# Patient Record
Sex: Male | Born: 1935 | Race: Black or African American | Hispanic: No | Marital: Single | Smoking: Never smoker
Health system: Southern US, Community
[De-identification: ages and names within clinical notes are randomized; demographics above are authoritative.]

## PROBLEM LIST (undated history)

## (undated) DIAGNOSIS — E119 Type 2 diabetes mellitus without complications: Secondary | ICD-10-CM

## (undated) DIAGNOSIS — K219 Gastro-esophageal reflux disease without esophagitis: Secondary | ICD-10-CM

## (undated) DIAGNOSIS — I69351 Hemiplegia and hemiparesis following cerebral infarction affecting right dominant side: Secondary | ICD-10-CM

## (undated) DIAGNOSIS — I252 Old myocardial infarction: Secondary | ICD-10-CM

## (undated) DIAGNOSIS — F0150 Vascular dementia without behavioral disturbance: Secondary | ICD-10-CM

## (undated) DIAGNOSIS — I639 Cerebral infarction, unspecified: Secondary | ICD-10-CM

## (undated) DIAGNOSIS — H532 Diplopia: Secondary | ICD-10-CM

## (undated) DIAGNOSIS — E559 Vitamin D deficiency, unspecified: Secondary | ICD-10-CM

## (undated) DIAGNOSIS — K5900 Constipation, unspecified: Secondary | ICD-10-CM

## (undated) DIAGNOSIS — H409 Unspecified glaucoma: Secondary | ICD-10-CM

## (undated) DIAGNOSIS — S42309A Unspecified fracture of shaft of humerus, unspecified arm, initial encounter for closed fracture: Secondary | ICD-10-CM

## (undated) DIAGNOSIS — I2510 Atherosclerotic heart disease of native coronary artery without angina pectoris: Secondary | ICD-10-CM

## (undated) DIAGNOSIS — I10 Essential (primary) hypertension: Secondary | ICD-10-CM

## (undated) HISTORY — PX: EXPLORATORY LAPAROTOMY: SUR591

---

## 1997-12-25 ENCOUNTER — Other Ambulatory Visit: Admission: RE | Admit: 1997-12-25 | Discharge: 1997-12-25 | Payer: Self-pay | Admitting: Family Medicine

## 1998-02-12 ENCOUNTER — Other Ambulatory Visit: Admission: RE | Admit: 1998-02-12 | Discharge: 1998-02-12 | Payer: Self-pay | Admitting: Family Medicine

## 1998-02-26 ENCOUNTER — Emergency Department (HOSPITAL_COMMUNITY): Admission: EM | Admit: 1998-02-26 | Discharge: 1998-02-26 | Payer: Self-pay | Admitting: Emergency Medicine

## 1998-05-11 ENCOUNTER — Emergency Department (HOSPITAL_COMMUNITY): Admission: EM | Admit: 1998-05-11 | Discharge: 1998-05-12 | Payer: Self-pay | Admitting: Emergency Medicine

## 1998-05-13 ENCOUNTER — Emergency Department (HOSPITAL_COMMUNITY): Admission: EM | Admit: 1998-05-13 | Discharge: 1998-05-13 | Payer: Self-pay | Admitting: Emergency Medicine

## 1999-08-04 ENCOUNTER — Inpatient Hospital Stay (HOSPITAL_COMMUNITY): Admission: EM | Admit: 1999-08-04 | Discharge: 1999-08-06 | Payer: Self-pay | Admitting: Emergency Medicine

## 1999-08-04 ENCOUNTER — Encounter: Payer: Self-pay | Admitting: Emergency Medicine

## 1999-08-05 ENCOUNTER — Encounter: Payer: Self-pay | Admitting: Internal Medicine

## 1999-09-30 ENCOUNTER — Inpatient Hospital Stay (HOSPITAL_COMMUNITY): Admission: RE | Admit: 1999-09-30 | Discharge: 1999-10-02 | Payer: Self-pay | Admitting: General Surgery

## 1999-11-17 ENCOUNTER — Encounter: Admission: RE | Admit: 1999-11-17 | Discharge: 1999-11-17 | Payer: Self-pay | Admitting: Family Medicine

## 1999-11-17 ENCOUNTER — Encounter: Payer: Self-pay | Admitting: Family Medicine

## 2000-01-01 ENCOUNTER — Encounter: Payer: Self-pay | Admitting: Emergency Medicine

## 2000-01-01 ENCOUNTER — Emergency Department (HOSPITAL_COMMUNITY): Admission: EM | Admit: 2000-01-01 | Discharge: 2000-01-01 | Payer: Self-pay | Admitting: *Deleted

## 2000-01-03 ENCOUNTER — Encounter: Payer: Self-pay | Admitting: Emergency Medicine

## 2000-01-03 ENCOUNTER — Emergency Department (HOSPITAL_COMMUNITY): Admission: EM | Admit: 2000-01-03 | Discharge: 2000-01-03 | Payer: Self-pay | Admitting: Emergency Medicine

## 2000-03-27 ENCOUNTER — Inpatient Hospital Stay (HOSPITAL_COMMUNITY): Admission: EM | Admit: 2000-03-27 | Discharge: 2000-03-31 | Payer: Self-pay | Admitting: Emergency Medicine

## 2000-03-27 ENCOUNTER — Encounter: Payer: Self-pay | Admitting: Emergency Medicine

## 2000-03-28 ENCOUNTER — Encounter: Payer: Self-pay | Admitting: Pulmonary Disease

## 2000-03-29 ENCOUNTER — Encounter: Payer: Self-pay | Admitting: Pulmonary Disease

## 2000-03-30 ENCOUNTER — Encounter: Payer: Self-pay | Admitting: Pulmonary Disease

## 2000-05-08 ENCOUNTER — Emergency Department (HOSPITAL_COMMUNITY): Admission: EM | Admit: 2000-05-08 | Discharge: 2000-05-08 | Payer: Self-pay | Admitting: Emergency Medicine

## 2000-05-10 ENCOUNTER — Emergency Department (HOSPITAL_COMMUNITY): Admission: EM | Admit: 2000-05-10 | Discharge: 2000-05-10 | Payer: Self-pay | Admitting: Emergency Medicine

## 2000-05-23 ENCOUNTER — Emergency Department (HOSPITAL_COMMUNITY): Admission: EM | Admit: 2000-05-23 | Discharge: 2000-05-23 | Payer: Self-pay | Admitting: Emergency Medicine

## 2000-06-18 ENCOUNTER — Encounter: Payer: Self-pay | Admitting: Emergency Medicine

## 2000-06-18 ENCOUNTER — Emergency Department (HOSPITAL_COMMUNITY): Admission: EM | Admit: 2000-06-18 | Discharge: 2000-06-18 | Payer: Self-pay | Admitting: Emergency Medicine

## 2001-05-10 ENCOUNTER — Encounter: Payer: Self-pay | Admitting: Emergency Medicine

## 2001-05-10 ENCOUNTER — Emergency Department (HOSPITAL_COMMUNITY): Admission: EM | Admit: 2001-05-10 | Discharge: 2001-05-10 | Payer: Self-pay | Admitting: Emergency Medicine

## 2001-05-14 ENCOUNTER — Emergency Department (HOSPITAL_COMMUNITY): Admission: EM | Admit: 2001-05-14 | Discharge: 2001-05-14 | Payer: Self-pay | Admitting: *Deleted

## 2001-06-30 ENCOUNTER — Emergency Department (HOSPITAL_COMMUNITY): Admission: EM | Admit: 2001-06-30 | Discharge: 2001-06-30 | Payer: Self-pay | Admitting: Emergency Medicine

## 2001-06-30 ENCOUNTER — Encounter: Payer: Self-pay | Admitting: Emergency Medicine

## 2002-01-19 ENCOUNTER — Emergency Department (HOSPITAL_COMMUNITY): Admission: EM | Admit: 2002-01-19 | Discharge: 2002-01-19 | Payer: Self-pay | Admitting: Emergency Medicine

## 2003-02-12 ENCOUNTER — Encounter: Admission: RE | Admit: 2003-02-12 | Discharge: 2003-02-12 | Payer: Self-pay | Admitting: Family Medicine

## 2003-02-12 ENCOUNTER — Encounter: Payer: Self-pay | Admitting: Family Medicine

## 2003-05-28 ENCOUNTER — Ambulatory Visit (HOSPITAL_COMMUNITY): Admission: RE | Admit: 2003-05-28 | Discharge: 2003-05-28 | Payer: Self-pay | Admitting: Ophthalmology

## 2003-07-15 ENCOUNTER — Encounter: Admission: RE | Admit: 2003-07-15 | Discharge: 2003-07-15 | Payer: Self-pay | Admitting: Family Medicine

## 2004-03-10 ENCOUNTER — Emergency Department (HOSPITAL_COMMUNITY): Admission: EM | Admit: 2004-03-10 | Discharge: 2004-03-10 | Payer: Self-pay | Admitting: Emergency Medicine

## 2005-12-14 ENCOUNTER — Encounter: Payer: Self-pay | Admitting: Emergency Medicine

## 2006-03-24 ENCOUNTER — Encounter: Admission: RE | Admit: 2006-03-24 | Discharge: 2006-03-24 | Payer: Self-pay | Admitting: Family Medicine

## 2012-07-19 ENCOUNTER — Encounter (INDEPENDENT_AMBULATORY_CARE_PROVIDER_SITE_OTHER): Payer: Self-pay | Admitting: Surgery

## 2013-10-16 ENCOUNTER — Other Ambulatory Visit: Payer: Self-pay | Admitting: *Deleted

## 2013-10-16 ENCOUNTER — Encounter (HOSPITAL_COMMUNITY): Payer: Self-pay | Admitting: Emergency Medicine

## 2013-10-16 ENCOUNTER — Ambulatory Visit
Admission: RE | Admit: 2013-10-16 | Discharge: 2013-10-16 | Disposition: A | Discharge status: Home / Self Care | Payer: PRIVATE HEALTH INSURANCE | Source: Ambulatory Visit | Attending: *Deleted | Admitting: *Deleted

## 2013-10-16 ENCOUNTER — Emergency Department (HOSPITAL_COMMUNITY)
Admission: EM | Admit: 2013-10-16 | Discharge: 2013-10-16 | Disposition: A | Discharge status: Home / Self Care | Payer: Medicare (Managed Care) | Attending: Emergency Medicine | Admitting: Emergency Medicine

## 2013-10-16 DIAGNOSIS — W108XXA Fall (on) (from) other stairs and steps, initial encounter: Secondary | ICD-10-CM | POA: Diagnosis not present

## 2013-10-16 DIAGNOSIS — Q899 Congenital malformation, unspecified: Secondary | ICD-10-CM

## 2013-10-16 DIAGNOSIS — Y929 Unspecified place or not applicable: Secondary | ICD-10-CM | POA: Diagnosis not present

## 2013-10-16 DIAGNOSIS — S46909A Unspecified injury of unspecified muscle, fascia and tendon at shoulder and upper arm level, unspecified arm, initial encounter: Secondary | ICD-10-CM | POA: Diagnosis present

## 2013-10-16 DIAGNOSIS — Z8719 Personal history of other diseases of the digestive system: Secondary | ICD-10-CM | POA: Diagnosis not present

## 2013-10-16 DIAGNOSIS — S42309A Unspecified fracture of shaft of humerus, unspecified arm, initial encounter for closed fracture: Secondary | ICD-10-CM

## 2013-10-16 DIAGNOSIS — W19XXXA Unspecified fall, initial encounter: Secondary | ICD-10-CM

## 2013-10-16 DIAGNOSIS — H409 Unspecified glaucoma: Secondary | ICD-10-CM | POA: Insufficient documentation

## 2013-10-16 DIAGNOSIS — E119 Type 2 diabetes mellitus without complications: Secondary | ICD-10-CM | POA: Insufficient documentation

## 2013-10-16 DIAGNOSIS — R52 Pain, unspecified: Secondary | ICD-10-CM

## 2013-10-16 DIAGNOSIS — M2560 Stiffness of unspecified joint, not elsewhere classified: Secondary | ICD-10-CM

## 2013-10-16 DIAGNOSIS — I2510 Atherosclerotic heart disease of native coronary artery without angina pectoris: Secondary | ICD-10-CM | POA: Diagnosis not present

## 2013-10-16 DIAGNOSIS — I252 Old myocardial infarction: Secondary | ICD-10-CM | POA: Diagnosis not present

## 2013-10-16 DIAGNOSIS — F0150 Vascular dementia without behavioral disturbance: Secondary | ICD-10-CM | POA: Insufficient documentation

## 2013-10-16 DIAGNOSIS — Z8639 Personal history of other endocrine, nutritional and metabolic disease: Secondary | ICD-10-CM | POA: Insufficient documentation

## 2013-10-16 DIAGNOSIS — Z8673 Personal history of transient ischemic attack (TIA), and cerebral infarction without residual deficits: Secondary | ICD-10-CM | POA: Diagnosis not present

## 2013-10-16 DIAGNOSIS — I10 Essential (primary) hypertension: Secondary | ICD-10-CM | POA: Diagnosis not present

## 2013-10-16 DIAGNOSIS — S4980XA Other specified injuries of shoulder and upper arm, unspecified arm, initial encounter: Secondary | ICD-10-CM | POA: Diagnosis present

## 2013-10-16 DIAGNOSIS — Y9301 Activity, walking, marching and hiking: Secondary | ICD-10-CM | POA: Diagnosis not present

## 2013-10-16 DIAGNOSIS — Z8669 Personal history of other diseases of the nervous system and sense organs: Secondary | ICD-10-CM | POA: Diagnosis not present

## 2013-10-16 DIAGNOSIS — W010XXA Fall on same level from slipping, tripping and stumbling without subsequent striking against object, initial encounter: Secondary | ICD-10-CM | POA: Insufficient documentation

## 2013-10-16 HISTORY — DX: Vitamin D deficiency, unspecified: E55.9

## 2013-10-16 HISTORY — DX: Unspecified glaucoma: H40.9

## 2013-10-16 HISTORY — DX: Cerebral infarction, unspecified: I63.9

## 2013-10-16 HISTORY — DX: Constipation, unspecified: K59.00

## 2013-10-16 HISTORY — DX: Gastro-esophageal reflux disease without esophagitis: K21.9

## 2013-10-16 HISTORY — DX: Diplopia: H53.2

## 2013-10-16 HISTORY — DX: Unspecified fracture of shaft of humerus, unspecified arm, initial encounter for closed fracture: S42.309A

## 2013-10-16 HISTORY — DX: Hemiplegia and hemiparesis following cerebral infarction affecting right dominant side: I69.351

## 2013-10-16 HISTORY — DX: Atherosclerotic heart disease of native coronary artery without angina pectoris: I25.10

## 2013-10-16 HISTORY — DX: Essential (primary) hypertension: I10

## 2013-10-16 HISTORY — DX: Vascular dementia, unspecified severity, without behavioral disturbance, psychotic disturbance, mood disturbance, and anxiety: F01.50

## 2013-10-16 HISTORY — DX: Old myocardial infarction: I25.2

## 2013-10-16 HISTORY — DX: Type 2 diabetes mellitus without complications: E11.9

## 2013-10-16 MED ORDER — ONDANSETRON 4 MG PO TBDP
8.0000 mg | ORAL_TABLET | Freq: Once | ORAL | Status: AC
  Administered 2013-10-16: 8 mg via ORAL
  Filled 2013-10-16: qty 2

## 2013-10-16 MED ORDER — HYDROCODONE-ACETAMINOPHEN 5-325 MG PO TABS: 2.0000 | ORAL_TABLET | ORAL | Status: AC | PRN

## 2013-10-16 MED ORDER — HYDROMORPHONE HCL PF 1 MG/ML IJ SOLN
1.0000 mg | Freq: Once | INTRAMUSCULAR | Status: AC
  Administered 2013-10-16: 1 mg via INTRAMUSCULAR
  Filled 2013-10-16: qty 1

## 2013-10-16 NOTE — ED Notes (Signed)
Paged ortho 

## 2013-10-16 NOTE — ED Notes (Signed)
Pt fell this morning by tripping and went to MD office. Has fractured humerus. Sling replaced in triage for comfort. No LOC with fall.

## 2013-10-16 NOTE — ED Notes (Signed)
Registration at bedside.

## 2013-10-16 NOTE — Progress Notes (Signed)
Orthopedic Tech Progress Note Patient Details:  Magdalene PatriciaCharles D Danzy 09/15/1935 [id number]  Ortho Devices Type of Ortho Device: Ace wrap;Arm sling;Coapt;Post (long arm) splint Ortho Device/Splint Location: LUE Ortho Device/Splint Interventions: Ordered;Application   Jennye MoccasinHughes, Meghanne Pletz Craig 10/16/2013, 7:16 PM

## 2013-10-16 NOTE — Discharge Instructions (Signed)
Humerus Fracture, Treated with Immobilization °The humerus is the large bone in your upper arm. You have a broken (fractured) humerus. These fractures are easily diagnosed with X-rays. °TREATMENT  °Simple fractures which will heal without disability are treated with simple immobilization. Immobilization means you will wear a cast, splint, or sling. You have a fracture which will do well with immobilization. The fracture will heal well simply by being held in a good position until it is stable enough to begin range of motion exercises. Do not take part in activities which would further injure your arm.  °HOME CARE INSTRUCTIONS  °· Put ice on the injured area. °· Put ice in a plastic bag. °· Place a towel between your skin and the bag. °· Leave the ice on for 15-20 minutes, 03-04 times a day. °· If you have a cast: °· Do not scratch the skin under the cast using sharp or pointed objects. °· Check the skin around the cast every day. You may put lotion on any red or sore areas. °· Keep your cast dry and clean. °· If you have a splint: °· Wear the splint as directed. °· Keep your splint dry and clean. °· You may loosen the elastic around the splint if your fingers become numb, tingle, or turn cold or blue. °· If you have a sling: °· Wear the sling as directed. °· Do not put pressure on any part of your cast or splint until it is fully hardened. °· Your cast or splint can be protected during bathing with a plastic bag. Do not lower the cast or splint into water. °· Only take over-the-counter or prescription medicines for pain, discomfort, or fever as directed by your caregiver. °· Do range of motion exercises as instructed by your caregiver. °· Follow up as directed by your caregiver. This is very important in order to avoid permanent injury or disability and chronic pain. °SEEK IMMEDIATE MEDICAL CARE IF:  °· Your skin or nails in the injured arm turn blue or gray. °· Your arm feels cold or numb. °· You develop severe  pain in the injured arm. °· You are having problems with the medicines you were given. °MAKE SURE YOU:  °· Understand these instructions. °· Will watch your condition. °· Will get help right away if you are not doing well or get worse. °Document Released: 11/07/2000 Document Revised: 10/24/2011 Document Reviewed: 09/15/2010 °ExitCare® Patient Information ©2014 [employer]. ° °

## 2013-10-16 NOTE — ED Notes (Signed)
Ortho tech at bedside 

## 2013-10-16 NOTE — ED Notes (Signed)
Pt sts he tripped on his feet this morning and fell down a few steps, went to PCP and then [facility] Then was told to come to ED for further evaluation of a humerus fracture. Pt c/o pain to left arm and left knee. Pt is currently wearing a sling on left arm. Pt has good capillary refill and good distal pulses. Pt has some swelling and deformity to left upper arm. Nad, skin warm and dry, resp e/u.

## 2013-10-16 NOTE — ED Provider Notes (Signed)
CSN: [id number]     Arrival date & time 10/16/13  1709 History   First MD Initiated Contact with Patient 10/16/13 1806     Chief Complaint  Patient presents with  . Arm Injury     (Consider location/radiation/quality/duration/timing/severity/associated sxs/prior Treatment) HPI Comments: Patient presents to the ER at the request of primary care doctor. Patient was sent to the ER for further evaluation of a humerus fracture. Patient tripped while walking earlier this morning and landed on his left side. He has been having pain in his left upper arm since the fall. He denies hitting his head, no loss of consciousness. Denies neck and back pain. Patient reports that he skinned his left knee, but has been walking without difficulty. He had an x-ray at his doctor's office which showed a fracture of the humerus, told to come to the ER for further evaluation. Patient complaining of moderate to severe pain in the upper arm. Pain is constant.  Patient is a 78 y.o. male presenting with arm injury.  Arm Injury Associated symptoms: no back pain and no neck pain     Past Medical History  Diagnosis Date  . Fracture closed, humerus left  . Stroke   . Hemiparesis affecting right side as late effect of stroke   . Vascular dementia   . Hypertension   . Diabetes mellitus without complication   . CAD (coronary artery disease)   . MI, old   . Glaucoma   . Diplopia   . GERD (gastroesophageal reflux disease)   . Constipation   . Vitamin D deficiency    Past Surgical History  Procedure Laterality Date  . Exploratory laparotomy     No family history on file. History  Substance Use Topics  . Smoking status: Never Smoker   . Smokeless tobacco: Not on file  . Alcohol Use: No    Review of Systems  Musculoskeletal: Negative for back pain and neck pain.       Arm pain   All other systems reviewed and are negative.      Allergies  Review of patient's allergies indicates no known  allergies.  Home Medications  No current outpatient prescriptions on file. BP 178/91  Pulse 78  Temp(Src) 99.1 F (37.3 C) (Oral)  SpO2 95% Physical Exam  Constitutional: He is oriented to person, place, and time. He appears well-developed and well-nourished. No distress.  HENT:  Head: Normocephalic and atraumatic.  Right Ear: Hearing normal.  Left Ear: Hearing normal.  Nose: Nose normal.  Mouth/Throat: Oropharynx is clear and moist and mucous membranes are normal.  Eyes: Conjunctivae and EOM are normal. Pupils are equal, round, and reactive to light.  Neck: Normal range of motion. Neck supple. No spinous process tenderness and no muscular tenderness present.  Cardiovascular: Regular rhythm, S1 normal and S2 normal.  Exam reveals no gallop and no friction rub.   No murmur heard. Pulses:      Radial pulses are 1+ on the right side, and 1+ on the left side.  Pulmonary/Chest: Effort normal and breath sounds normal. No respiratory distress. He exhibits no tenderness.  Abdominal: Soft. Normal appearance and bowel sounds are normal. There is no hepatosplenomegaly. There is no tenderness. There is no rebound, no guarding, no tenderness at McBurney's point and negative Murphy's sign. No hernia.  Musculoskeletal: Normal range of motion.       Cervical back: Normal.       Thoracic back: Normal.       Lumbar  back: Normal.       Left upper arm: He exhibits tenderness and bony tenderness.  Neurological: He is alert and oriented to person, place, and time. He has normal strength. No cranial nerve deficit or sensory deficit. Coordination normal. GCS eye subscore is 4. GCS verbal subscore is 5. GCS motor subscore is 6.  Normal sensation over the dorsal aspect of the distal forearm and hand/fingers  Normal extension at the wrist and all 5 fingers  Skin: Skin is warm, dry and intact. No rash noted. No cyanosis.  Psychiatric: He has a normal mood and affect. His speech is normal and behavior is  normal. Thought content normal.    ED Course  Procedures (including critical care time) Labs Review Labs Reviewed - No data to display Imaging Review Dg Shoulder Left  10/16/2013   CLINICAL DATA:  Shoulder pain status post fall  EXAM: LEFT SHOULDER - 2+ VIEW  COMPARISON:  None.  FINDINGS: The patient has sustained an acute fracture of the proximal 1/3 of the shaft of the left humerus. The relationship of the humeral head to the bony glenoid is grossly normal. There are mild degenerative changes as well as at [facility]. The observed portions of the clavicle and scapula exhibit no acute fracture.  IMPRESSION: This single view reveals a angulated disc tract and in displaced fracture of the proximal 1/3 of the shaft of the left humerus. No definite fracture of the glenohumeral joint is demonstrated.  These results will be called to the ordering clinician or representative by the Radiologist Assistant, and communication documented in the PACS Dashboard.   Electronically Signed   By: David  Swazil[place]   On: 10/16/2013 16:22   Dg Humerus Left  10/16/2013   CLINICAL DATA:  Patient fell today with decreased range of motion  EXAM: LEFT HUMERUS - 2+ VIEW  COMPARISON:  None.  FINDINGS: There is a transverse slightly obliqued fracture through the mid proximal left humerus. There is distraction of the fracture fragments by approximately 14 mm. On the transthoracic view there is slight angulation at the fracture site with anterior bowing of 24 degrees. Degenerative change is noted in the left shoulder with some loss of subacromial joint space and acromial spurring.  IMPRESSION: 1. Slightly angulated and distracted fracture of the mid proximal left humerus.   Electronically Signed   By: Dwyane DeePaul  Barry M.D.   On: 10/16/2013 16:59     EKG Interpretation None      MDM   Final diagnoses:  None   Patient referred to the emergency department by his primary care doctor for further evaluation of humerus fracture. His  x-ray was reviewed. He does have essentially a midshaft fracture with distraction and angulation. Radial nerve function appears to be intact. Case was discussed with Doctor Luiz BlareGraves, on-call for orthopedics. He will be seen in the office tomorrow. Doctor Graves as recommended coaptation and posterior arm splinting with swelling, pain control.    Gilda Creasehristopher J. Leo Weyandt, MD 10/16/13 [phone]

## 2013-10-17 ENCOUNTER — Telehealth (HOSPITAL_COMMUNITY): Payer: Self-pay

## 2013-10-17 NOTE — ED Notes (Signed)
Call rcvd from pts daughter stating they had tried to make appt w/ ortho and they needed referral from us.  FM was given # where pt could be reached (sons #) obtained permission from pt to speak w/son.  Son stated they were waiting for call back to sched appt but he had given info to ortho office this am.  I called ortho to clarify they had everything they needed and was told they did and office would be calling to schedule appt .  Gave sons # to them .  Son informed every thing in place.

## 2014-03-26 ENCOUNTER — Other Ambulatory Visit: Payer: Self-pay | Admitting: Family Medicine

## 2014-03-26 ENCOUNTER — Ambulatory Visit
Admission: RE | Admit: 2014-03-26 | Discharge: 2014-03-26 | Disposition: A | Discharge status: Home / Self Care | Payer: PRIVATE HEALTH INSURANCE | Source: Ambulatory Visit | Attending: Family Medicine | Admitting: Family Medicine

## 2014-03-26 DIAGNOSIS — M25562 Pain in left knee: Secondary | ICD-10-CM

## 2014-10-20 ENCOUNTER — Ambulatory Visit (INDEPENDENT_AMBULATORY_CARE_PROVIDER_SITE_OTHER): Payer: Medicare (Managed Care) | Admitting: Podiatry

## 2014-10-20 ENCOUNTER — Encounter: Payer: Self-pay | Admitting: Podiatry

## 2014-10-20 VITALS — BP 193/69 | HR 57 | Resp 16 | Ht 71.0 in | Wt 225.0 lb

## 2014-10-20 DIAGNOSIS — B351 Tinea unguium: Secondary | ICD-10-CM | POA: Diagnosis not present

## 2014-10-20 DIAGNOSIS — M79676 Pain in unspecified toe(s): Secondary | ICD-10-CM | POA: Diagnosis not present

## 2014-10-20 NOTE — Progress Notes (Signed)
   Subjective:    Patient ID: Edward Stuart, male    DOB: 12/30/1935, 79 y.o.   MRN: [id number]  HPI Comments: N diabetic foot exam and debridement L 10 toenails D and O long-term C elongated, thickened  A diabetic pt, and stroke pt T none  Diabetes   Patient denies any history of skin ulceration or claudication   Review of Systems  Musculoskeletal:       Left knee pain seen 10/17/2014 by Dr. Graves.  All other systems reviewed and are negative.      Objective:   Physical Exam  Orientated 3  Vascular: DP pulses 0/4 bilaterally PT pulses 0/4 bilaterally  Neurological: Sensation to 10 g monofilament wire intact 5/5 bilaterally Vibratory sensation intact bilaterally Ankle reflex equal and reactive bilaterally  Dermatological: Well-healed surgical scar medial right first MPJ No skin lesions noted bilaterally The toenails are extremely hypertrophic, elongated, discolored, incurvated and tender to palpation 6-10  Musculoskeletal: HAV deformities bilaterally Hammertoe deformities 2-4 bilaterally Patient has slow gait assisted with roller walker      Assessment & Plan:   Assessment: Diminished pedal pulses suggestive of peripheral arterial disease Protective sensation intact bilaterally Symptomatic onychomycoses 6-10  Plan: Debridement toenails 10 without a bleeding  Reappoint at three-month intervals 

## 2014-10-20 NOTE — Patient Instructions (Signed)
Diabetes and Foot Care Diabetes may cause you to have problems because of poor blood supply (circulation) to your feet and legs. This may cause the skin on your feet to become thinner, break easier, and heal more slowly. Your skin may become dry, and the skin may peel and crack. You may also have nerve damage in your legs and feet causing decreased feeling in them. You may not notice minor injuries to your feet that could lead to infections or more serious problems. Taking care of your feet is one of the most important things you can do for yourself.  HOME CARE INSTRUCTIONS  Wear shoes at all times, even in the house. Do not go barefoot. Bare feet are easily injured.  Check your feet daily for blisters, cuts, and redness. If you cannot see the bottom of your feet, use a mirror or ask someone for help.  Wash your feet with warm water (do not use hot water) and mild soap. Then pat your feet and the areas between your toes until they are completely dry. Do not soak your feet as this can dry your skin.  Apply a moisturizing lotion or petroleum jelly (that does not contain alcohol and is unscented) to the skin on your feet and to dry, brittle toenails. Do not apply lotion between your toes.  Trim your toenails straight across. Do not dig under them or around the cuticle. File the edges of your nails with an emery board or nail file.  Do not cut corns or calluses or try to remove them with medicine.  Wear clean socks or stockings every day. Make sure they are not too tight. Do not wear knee-high stockings since they may decrease blood flow to your legs.  Wear shoes that fit properly and have enough cushioning. To break in new shoes, wear them for just a few hours a day. This prevents you from injuring your feet. Always look in your shoes before you put them on to be sure there are no objects inside.  Do not cross your legs. This may decrease the blood flow to your feet.  If you find a minor scrape,  cut, or break in the skin on your feet, keep it and the skin around it clean and dry. These areas may be cleansed with mild soap and water. Do not cleanse the area with peroxide, alcohol, or iodine.  When you remove an adhesive bandage, be sure not to damage the skin around it.  If you have a wound, look at it several times a day to make sure it is healing.  Do not use heating pads or hot water bottles. They may burn your skin. If you have lost feeling in your feet or legs, you may not know it is happening until it is too late.  Make sure your health care provider performs a complete foot exam at least annually or more often if you have foot problems. Report any cuts, sores, or bruises to your health care provider immediately. SEEK MEDICAL CARE IF:   You have an injury that is not healing.  You have cuts or breaks in the skin.  You have an ingrown nail.  You notice redness on your legs or feet.  You feel burning or tingling in your legs or feet.  You have pain or cramps in your legs and feet.  Your legs or feet are numb.  Your feet always feel cold. SEEK IMMEDIATE MEDICAL CARE IF:   There is increasing redness,   swelling, or pain in or around a wound.  There is a red line that goes up your leg.  Pus is coming from a wound.  You develop a fever or as directed by your health care provider.  You notice a bad smell coming from an ulcer or wound. Document Released: 07/29/2000 Document Revised: 04/03/2013 Document Reviewed: 01/08/2013 ExitCare Patient Information 2015 [employer]. This information is not intended to replace advice given to you by your health care provider. Make sure you discuss any questions you have with your health care provider.  

## 2015-01-19 ENCOUNTER — Ambulatory Visit (INDEPENDENT_AMBULATORY_CARE_PROVIDER_SITE_OTHER): Payer: Medicare (Managed Care) | Admitting: Podiatry

## 2015-01-19 ENCOUNTER — Ambulatory Visit: Payer: Medicare (Managed Care) | Admitting: Podiatry

## 2015-01-19 DIAGNOSIS — M79676 Pain in unspecified toe(s): Secondary | ICD-10-CM

## 2015-01-19 DIAGNOSIS — B351 Tinea unguium: Secondary | ICD-10-CM

## 2015-01-19 NOTE — Progress Notes (Signed)
   Subjective:    Patient ID: Edward Stuart, male    DOB: May 21, 1936, 79 y.o.   MRN: [id number]  HPI Comments: N diabetic foot exam and debridement L 10 toenails D and O long-term C elongated, thickened  A diabetic pt, and stroke pt T none  Diabetes   Patient denies any history of skin ulceration or claudication   Review of Systems  Musculoskeletal:       Left knee pain seen 10/17/2014 by Dr. Luiz BlareGraves.  All other systems reviewed and are negative.      Objective:   Physical Exam  Orientated 3  Vascular: DP pulses 0/4 bilaterally PT pulses 0/4 bilaterally  Neurological: Sensation to 10 g monofilament wire intact 5/5 bilaterally Vibratory sensation intact bilaterally Ankle reflex equal and reactive bilaterally  Dermatological: Well-healed surgical scar medial right first MPJ No skin lesions noted bilaterally The toenails are extremely hypertrophic, elongated, discolored, incurvated and tender to palpation 6-10  Musculoskeletal: HAV deformities bilaterally Hammertoe deformities 2-4 bilaterally Patient has slow gait assisted with roller walker      Assessment & Plan:   Assessment: Diminished pedal pulses suggestive of peripheral arterial disease Protective sensation intact bilaterally Symptomatic onychomycoses 6-10  Plan: Debridement toenails 10 without a bleeding  Reappoint at three-month intervals

## 2015-01-21 NOTE — Progress Notes (Signed)
Subjective:     Patient ID: Edward Stuart, male   DOB: Dec 06, 1935, 79 y.o.   MRN: [id number]  HPI patient presents with the long gaited thick nails 1-5 both feet that are painful when pressed and patient states he cannot cut himself   Review of Systems     Objective:   Physical Exam Neurovascular status unchanged with thick yellow brittle nailbeds 1-5 both feet that are painful when pressed    Assessment:     Mycotic nail infections with pain 1-5 both feet    Plan:     Debris painful nailbeds 1-5 both feet with no iatrogenic bleeding noted

## 2015-04-30 ENCOUNTER — Encounter: Payer: Self-pay | Admitting: Podiatry

## 2015-04-30 ENCOUNTER — Ambulatory Visit (INDEPENDENT_AMBULATORY_CARE_PROVIDER_SITE_OTHER): Payer: Medicare (Managed Care) | Admitting: Podiatry

## 2015-04-30 DIAGNOSIS — M79676 Pain in unspecified toe(s): Secondary | ICD-10-CM | POA: Diagnosis not present

## 2015-04-30 DIAGNOSIS — B351 Tinea unguium: Secondary | ICD-10-CM

## 2015-04-30 NOTE — Progress Notes (Signed)
Patient ID: Edward Stuart, male   DOB: November 10, 1935, 79 y.o.   MRN: [id number] Complaint:  Visit Type: Patient returns to my office for continued preventative foot care services. Complaint: Patient states" my nails have grown long and thick and become painful to walk and wear shoes" . The patient presents for preventative foot care services. No changes to ROS  Podiatric Exam: Vascular: dorsalis pedis and posterior tibial pulses are palpable bilateral. Capillary return is immediate. Temperature gradient is WNL. Skin turgor WNL  Sensorium: Normal Semmes Weinstein monofilament test. Normal tactile sensation bilaterally. Nail Exam: Pt has thick disfigured discolored nails with subungual debris noted bilateral entire nail hallux through fifth toenails Ulcer Exam: There is no evidence of ulcer or pre-ulcerative changes or infection. Orthopedic Exam: Muscle tone and strength are WNL. No limitations in general ROM. No crepitus or effusions noted. Foot type and digits show no abnormalities. Bony prominences are unremarkable. Skin: No Porokeratosis. No infection or ulcers  Diagnosis:  Onychomycosis, , Pain in right toe, pain in left toes  Treatment & Plan Procedures and Treatment: Consent by patient was obtained for treatment procedures. The patient understood the discussion of treatment and procedures well. All questions were answered thoroughly reviewed. Debridement of mycotic and hypertrophic toenails, 1 through 5 bilateral and clearing of subungual debris. No ulceration, no infection noted.  Return Visit-Office Procedure: Patient instructed to return to the office for a follow up visit 3 months for continued evaluation and treatment.

## 2015-08-13 ENCOUNTER — Ambulatory Visit (INDEPENDENT_AMBULATORY_CARE_PROVIDER_SITE_OTHER): Payer: Medicare (Managed Care) | Admitting: Podiatry

## 2015-08-13 ENCOUNTER — Encounter: Payer: Self-pay | Admitting: Podiatry

## 2015-08-13 DIAGNOSIS — B351 Tinea unguium: Secondary | ICD-10-CM | POA: Diagnosis not present

## 2015-08-13 DIAGNOSIS — M79676 Pain in unspecified toe(s): Secondary | ICD-10-CM

## 2015-08-13 NOTE — Progress Notes (Signed)
Patient ID: Edward Stuart, male   DOB: [birth year], 79 y.o.   MRN: [id number] Complaint:  Visit Type: Patient returns to my office for continued preventative foot care services. Complaint: Patient states" my nails have grown long and thick and become painful to walk and wear shoes" . The patient presents for preventative foot care services. No changes to ROS  Podiatric Exam: Vascular: dorsalis pedis and posterior tibial pulses are palpable bilateral. Capillary return is immediate. Temperature gradient is WNL. Skin turgor WNL  Sensorium: Normal Semmes Weinstein monofilament test. Normal tactile sensation bilaterally. Nail Exam: Pt has thick disfigured discolored nails with subungual debris noted bilateral entire nail hallux through fifth toenails Ulcer Exam: There is no evidence of ulcer or pre-ulcerative changes or infection. Orthopedic Exam: Muscle tone and strength are WNL. No limitations in general ROM. No crepitus or effusions noted. Foot type and digits show no abnormalities. Bony prominences are unremarkable. Skin: No Porokeratosis. No infection or ulcers  Diagnosis:  Onychomycosis, , Pain in right toe, pain in left toes  Treatment & Plan Procedures and Treatment: Consent by patient was obtained for treatment procedures. The patient understood the discussion of treatment and procedures well. All questions were answered thoroughly reviewed. Debridement of mycotic and hypertrophic toenails, 1 through 5 bilateral and clearing of subungual debris. No ulceration, no infection noted.  Return Visit-Office Procedure: Patient instructed to return to the office for a follow up visit 3 months for continued evaluation and treatment.   Helane GuntherGregory Merl Bommarito DPM

## 2015-11-12 ENCOUNTER — Ambulatory Visit (INDEPENDENT_AMBULATORY_CARE_PROVIDER_SITE_OTHER): Payer: Medicare (Managed Care) | Admitting: Podiatry

## 2015-11-12 ENCOUNTER — Encounter: Payer: Self-pay | Admitting: Podiatry

## 2015-11-12 DIAGNOSIS — B351 Tinea unguium: Secondary | ICD-10-CM

## 2015-11-12 DIAGNOSIS — M79676 Pain in unspecified toe(s): Secondary | ICD-10-CM

## 2015-11-12 NOTE — Progress Notes (Signed)
Patient ID: Magdalene PatriciaCharles D Wrigley, male   DOB: Nov 23, 1935, 80 y.o.   MRN: [id number] Complaint:  Visit Type: Patient returns to my office for continued preventative foot care services. Complaint: Patient states" my nails have grown long and thick and become painful to walk and wear shoes" . The patient presents for preventative foot care services. Patient is diabetic with no foot complications. No changes to ROS  Podiatric Exam: Vascular: dorsalis pedis and posterior tibial pulses are palpable bilateral. Capillary return is immediate. Temperature gradient is WNL. Skin turgor WNL  Sensorium: Normal Semmes Weinstein monofilament test. Normal tactile sensation bilaterally. Nail Exam: Pt has thick disfigured discolored nails with subungual debris noted bilateral entire nail hallux through fifth toenails Ulcer Exam: There is no evidence of ulcer or pre-ulcerative changes or infection. Orthopedic Exam: Muscle tone and strength are WNL. No limitations in general ROM. No crepitus or effusions noted. Foot type and digits show no abnormalities. Severe HAV deformity B/L. Skin: No Porokeratosis. No infection or ulcers.  Skin lesion on lateral aspect right hallux secondary to bunion.  Diagnosis:  Onychomycosis, , Pain in right toe, pain in left toes  Treatment & Plan Procedures and Treatment: Consent by patient was obtained for treatment procedures. The patient understood the discussion of treatment and procedures well. All questions were answered thoroughly reviewed. Debridement of mycotic and hypertrophic toenails, 1 through 5 bilateral and clearing of subungual debris. No ulceration, no infection noted.  Digital pads given for skin lesion. Return Visit-Office Procedure: Patient instructed to return to the office for a follow up visit 3 months for continued evaluation and treatment.   Helane GuntherGregory Itha Kroeker DPM

## 2016-01-14 DEATH — deceased

## 2016-02-04 ENCOUNTER — Ambulatory Visit: Payer: Medicare (Managed Care) | Admitting: Podiatry

## 2016-02-23 IMAGING — CR DG KNEE COMPLETE 4+V*L*
4 series · 4 of 4 positions shown · non-contrast
Comparison: None.

CLINICAL DATA: Left knee pain

EXAM:
LEFT KNEE - COMPLETE 4+ VIEW

[view not recorded (1 of 4)]
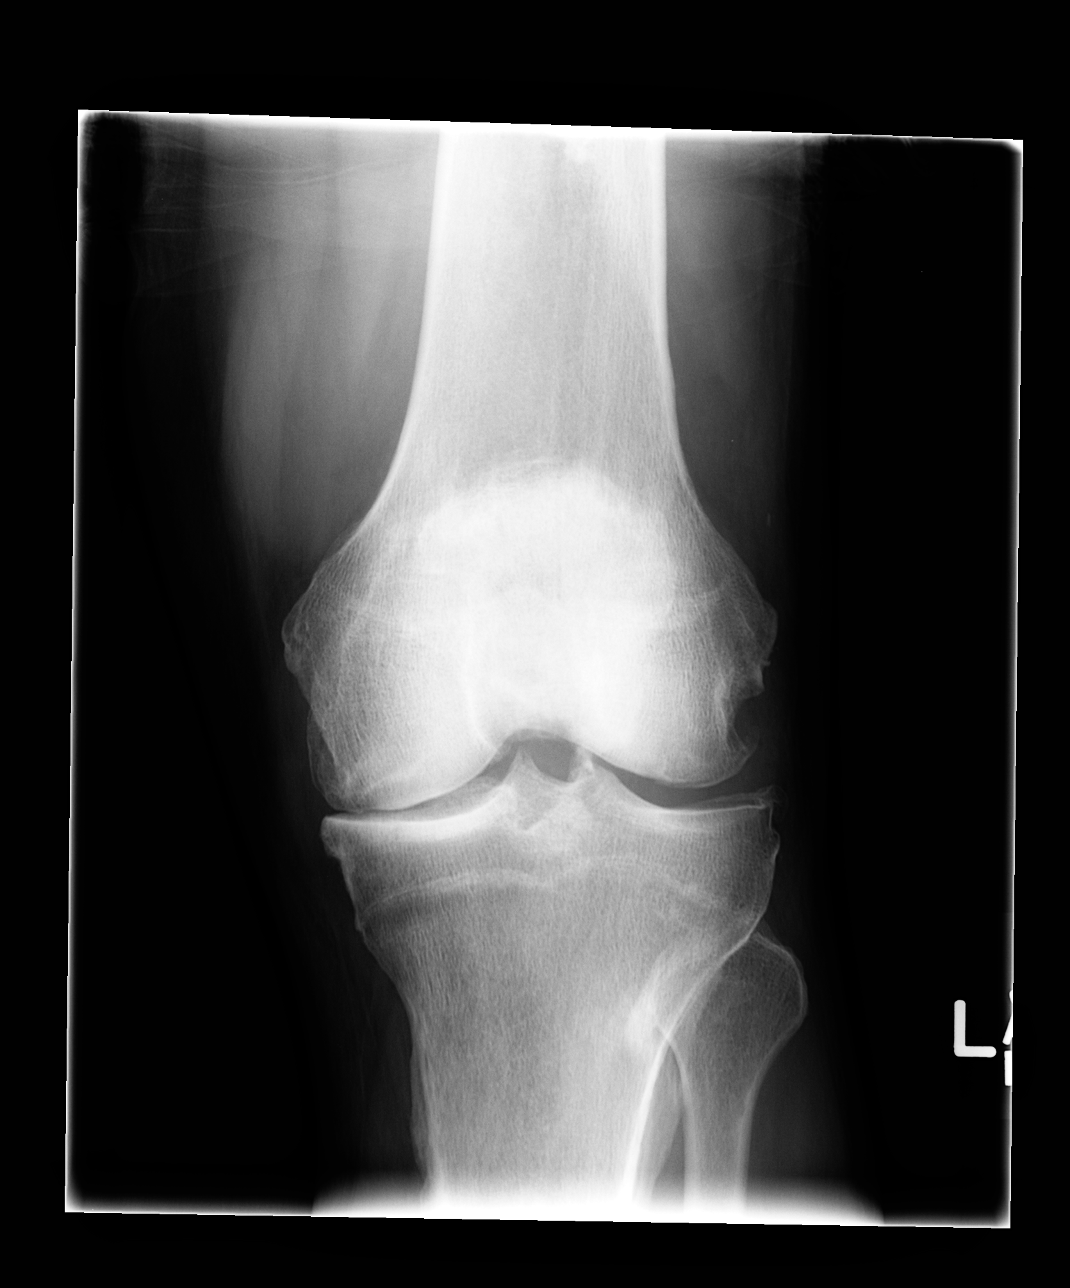

[view not recorded (2 of 4)]
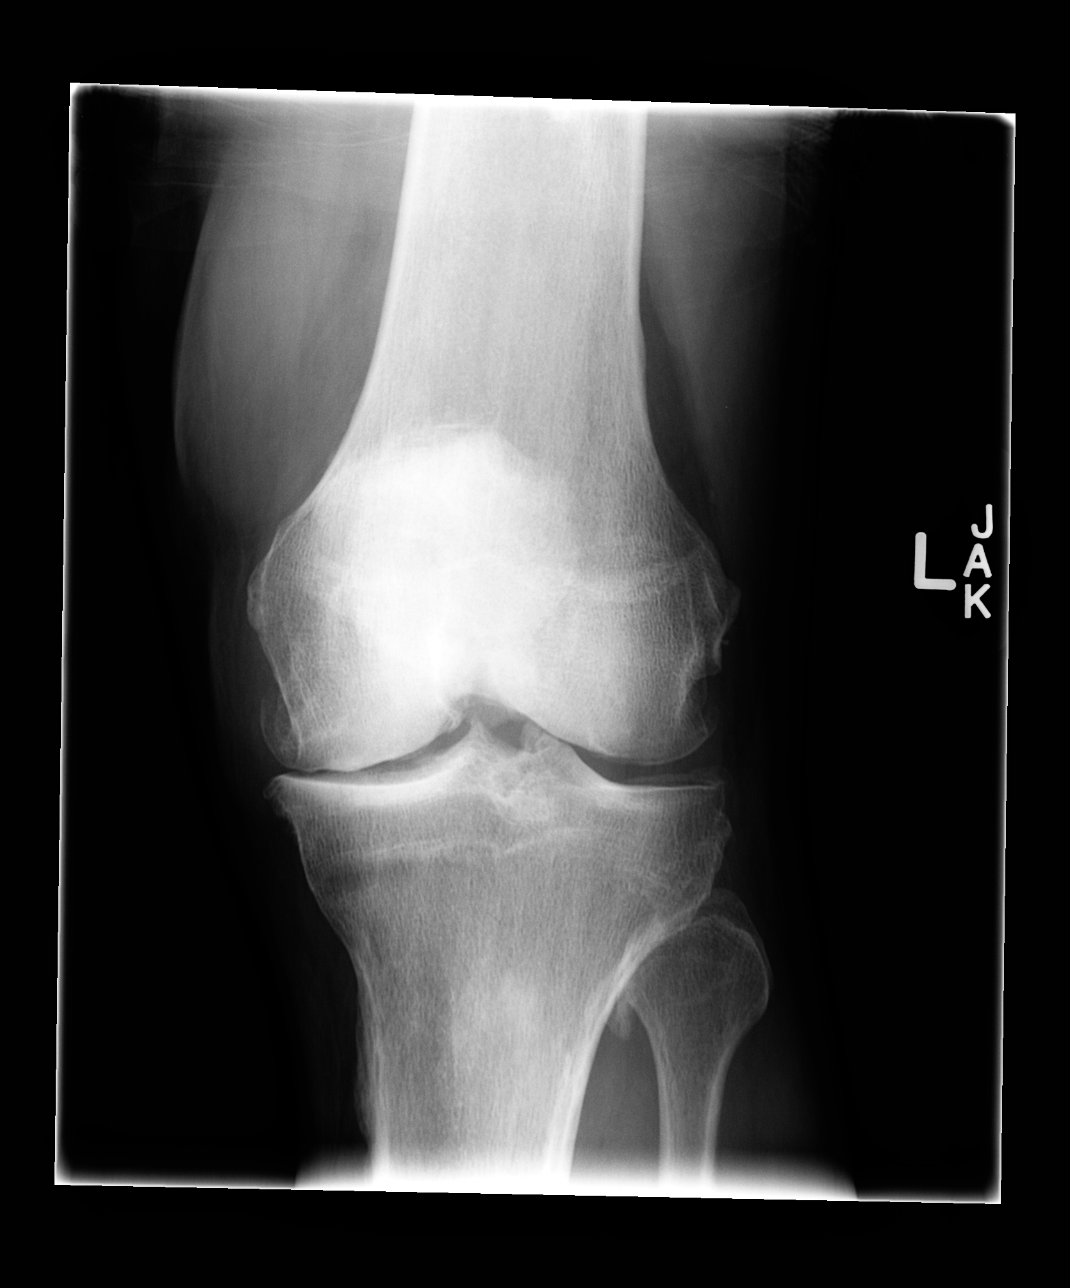

[view not recorded (3 of 4)]
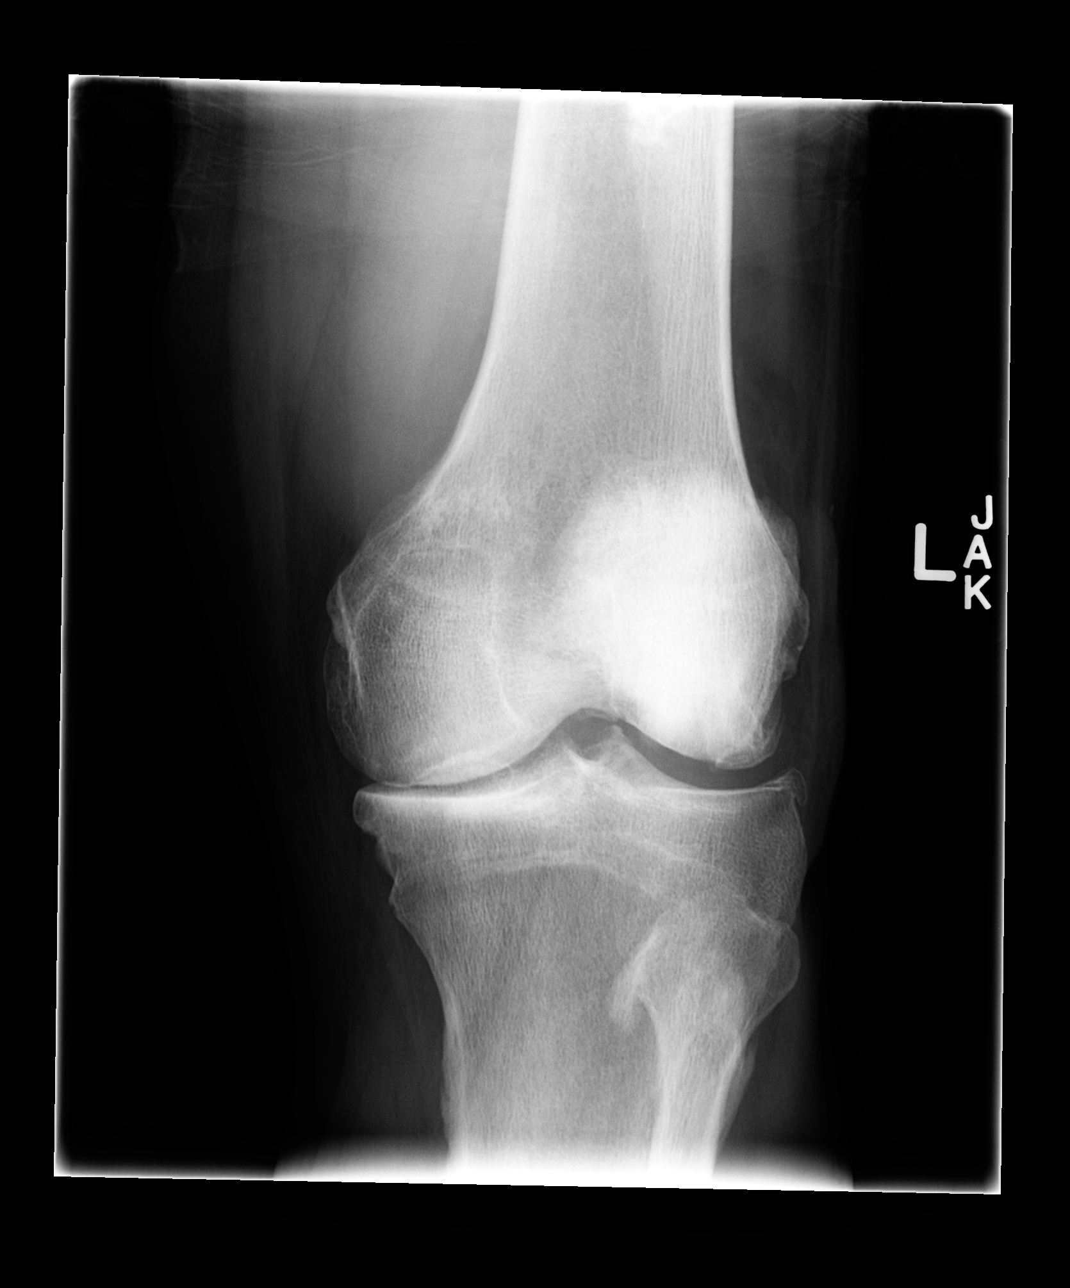

[view not recorded (4 of 4)]
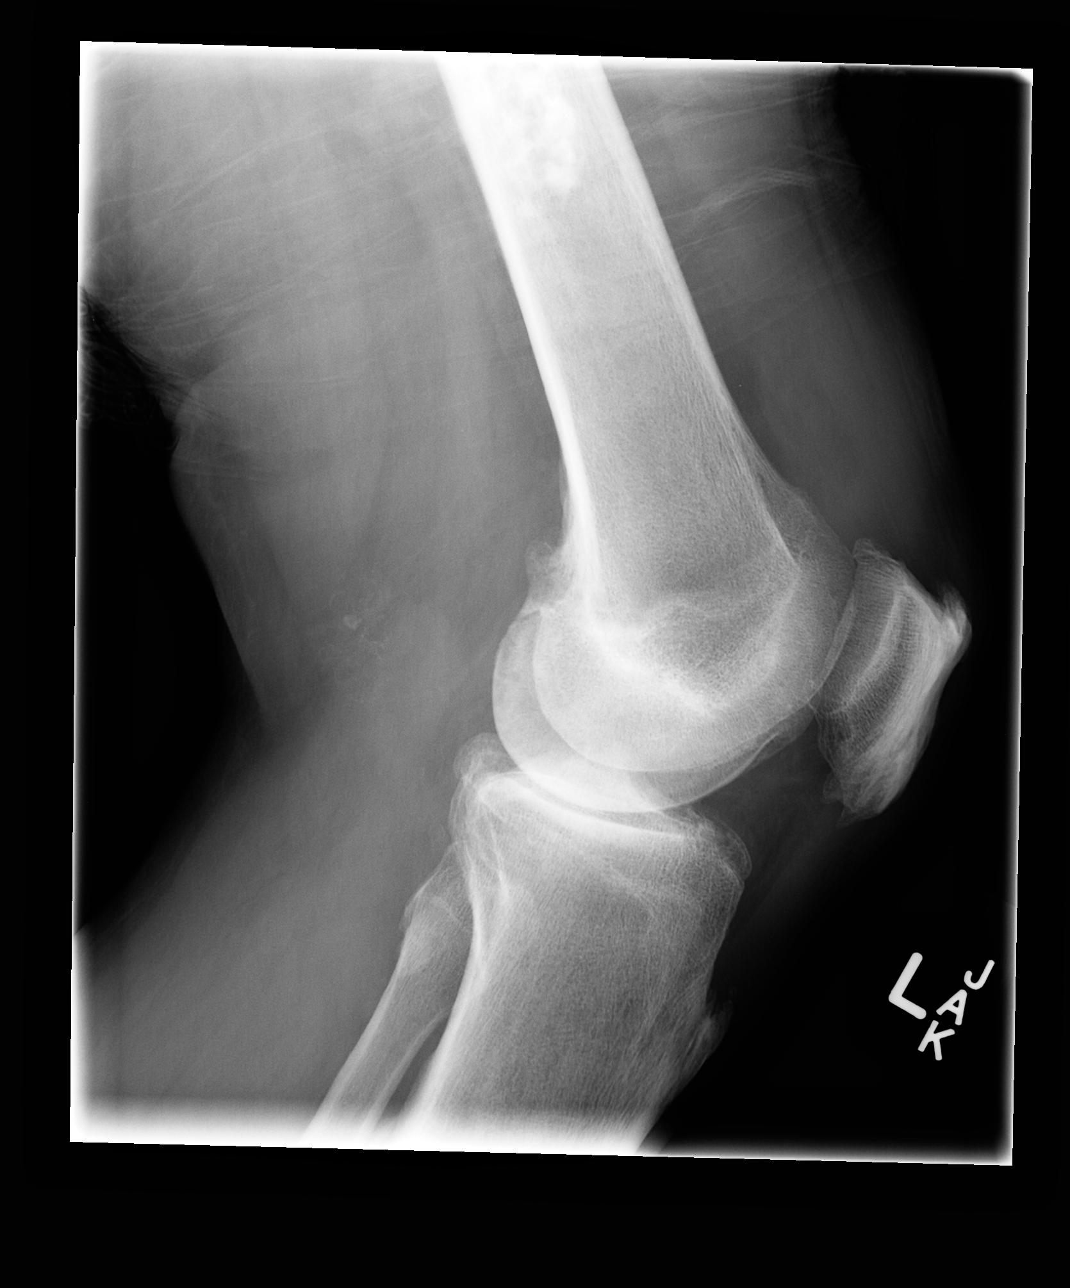

[4 of 4 positions shown; findings below may reference images not displayed]

FINDINGS: There is no evidence of fracture or dislocation. Small joint
effusion. Tricompartmental osteoarthritis most severe in the medial
femorotibial compartment. Soft tissues are unremarkable.
IMPRESSION: Tricompartmental osteoarthritis of the left knee.
